# Patient Record
Sex: Female | Born: 1955 | Race: White | Hispanic: No | Marital: Married | Smoking: Never smoker
Health system: Southern US, Community
[De-identification: ages and names within clinical notes are randomized; demographics above are authoritative.]

## PROBLEM LIST (undated history)

## (undated) DIAGNOSIS — F32A Depression, unspecified: Secondary | ICD-10-CM

## (undated) DIAGNOSIS — E079 Disorder of thyroid, unspecified: Secondary | ICD-10-CM

## (undated) DIAGNOSIS — H04129 Dry eye syndrome of unspecified lacrimal gland: Secondary | ICD-10-CM

## (undated) DIAGNOSIS — I341 Nonrheumatic mitral (valve) prolapse: Secondary | ICD-10-CM

## (undated) DIAGNOSIS — D509 Iron deficiency anemia, unspecified: Secondary | ICD-10-CM

## (undated) DIAGNOSIS — F329 Major depressive disorder, single episode, unspecified: Secondary | ICD-10-CM

## (undated) DIAGNOSIS — M3500 Sicca syndrome, unspecified: Secondary | ICD-10-CM

## (undated) DIAGNOSIS — F419 Anxiety disorder, unspecified: Secondary | ICD-10-CM

## (undated) DIAGNOSIS — M810 Age-related osteoporosis without current pathological fracture: Secondary | ICD-10-CM

## (undated) HISTORY — DX: Iron deficiency anemia, unspecified: D50.9

## (undated) HISTORY — DX: Disorder of thyroid, unspecified: E07.9

## (undated) HISTORY — DX: Sicca syndrome, unspecified: M35.00

## (undated) HISTORY — DX: Major depressive disorder, single episode, unspecified: F32.9

## (undated) HISTORY — DX: Anxiety disorder, unspecified: F41.9

## (undated) HISTORY — DX: Dry eye syndrome of unspecified lacrimal gland: H04.129

## (undated) HISTORY — DX: Age-related osteoporosis without current pathological fracture: M81.0

## (undated) HISTORY — PX: ENDOMETRIAL BIOPSY: SHX622

## (undated) HISTORY — DX: Nonrheumatic mitral (valve) prolapse: I34.1

## (undated) HISTORY — DX: Depression, unspecified: F32.A

## (undated) HISTORY — PX: TUBAL LIGATION: SHX77

---

## 1999-02-05 ENCOUNTER — Emergency Department (HOSPITAL_COMMUNITY): Admission: EM | Admit: 1999-02-05 | Discharge: 1999-02-05 | Payer: Self-pay | Admitting: *Deleted

## 1999-04-12 HISTORY — PX: MOUTH SURGERY: SHX715

## 1999-08-18 ENCOUNTER — Other Ambulatory Visit: Admission: RE | Admit: 1999-08-18 | Discharge: 1999-08-18 | Payer: Self-pay | Admitting: Obstetrics and Gynecology

## 1999-10-04 ENCOUNTER — Ambulatory Visit (HOSPITAL_COMMUNITY): Admission: RE | Admit: 1999-10-04 | Discharge: 1999-10-04 | Payer: Self-pay | Admitting: Obstetrics and Gynecology

## 1999-10-04 ENCOUNTER — Encounter: Payer: Self-pay | Admitting: Obstetrics and Gynecology

## 2000-08-16 ENCOUNTER — Other Ambulatory Visit: Admission: RE | Admit: 2000-08-16 | Discharge: 2000-08-16 | Payer: Self-pay | Admitting: Obstetrics and Gynecology

## 2000-09-26 ENCOUNTER — Ambulatory Visit (HOSPITAL_COMMUNITY): Admission: RE | Admit: 2000-09-26 | Discharge: 2000-09-27 | Payer: Self-pay | Admitting: *Deleted

## 2000-10-19 ENCOUNTER — Ambulatory Visit (HOSPITAL_COMMUNITY): Admission: RE | Admit: 2000-10-19 | Discharge: 2000-10-19 | Payer: Self-pay | Admitting: Obstetrics and Gynecology

## 2000-10-19 ENCOUNTER — Encounter: Payer: Self-pay | Admitting: Obstetrics and Gynecology

## 2002-02-13 ENCOUNTER — Other Ambulatory Visit: Admission: RE | Admit: 2002-02-13 | Discharge: 2002-02-13 | Payer: Self-pay | Admitting: Obstetrics and Gynecology

## 2002-05-08 ENCOUNTER — Encounter: Admission: RE | Admit: 2002-05-08 | Discharge: 2002-05-08 | Payer: Self-pay | Admitting: Internal Medicine

## 2002-05-08 ENCOUNTER — Encounter: Payer: Self-pay | Admitting: Internal Medicine

## 2002-05-13 ENCOUNTER — Encounter: Payer: Self-pay | Admitting: Internal Medicine

## 2002-05-13 ENCOUNTER — Ambulatory Visit (HOSPITAL_COMMUNITY): Admission: RE | Admit: 2002-05-13 | Discharge: 2002-05-13 | Payer: Self-pay | Admitting: Internal Medicine

## 2003-04-29 ENCOUNTER — Other Ambulatory Visit: Admission: RE | Admit: 2003-04-29 | Discharge: 2003-04-29 | Payer: Self-pay | Admitting: Obstetrics and Gynecology

## 2003-05-27 ENCOUNTER — Ambulatory Visit (HOSPITAL_COMMUNITY): Admission: RE | Admit: 2003-05-27 | Discharge: 2003-05-27 | Payer: Self-pay | Admitting: Obstetrics and Gynecology

## 2004-02-17 ENCOUNTER — Ambulatory Visit (HOSPITAL_COMMUNITY): Admission: RE | Admit: 2004-02-17 | Discharge: 2004-02-17 | Payer: Self-pay | Admitting: Internal Medicine

## 2004-03-09 ENCOUNTER — Encounter: Admission: RE | Admit: 2004-03-09 | Discharge: 2004-03-09 | Payer: Self-pay | Admitting: Internal Medicine

## 2004-05-12 ENCOUNTER — Other Ambulatory Visit: Admission: RE | Admit: 2004-05-12 | Discharge: 2004-05-12 | Payer: Self-pay | Admitting: Obstetrics and Gynecology

## 2004-08-16 ENCOUNTER — Ambulatory Visit (HOSPITAL_COMMUNITY): Admission: RE | Admit: 2004-08-16 | Discharge: 2004-08-16 | Payer: Self-pay | Admitting: Obstetrics and Gynecology

## 2005-07-10 DIAGNOSIS — D509 Iron deficiency anemia, unspecified: Secondary | ICD-10-CM

## 2005-07-10 HISTORY — DX: Iron deficiency anemia, unspecified: D50.9

## 2005-07-14 ENCOUNTER — Other Ambulatory Visit: Admission: RE | Admit: 2005-07-14 | Discharge: 2005-07-14 | Payer: Self-pay | Admitting: Obstetrics & Gynecology

## 2005-08-17 ENCOUNTER — Ambulatory Visit (HOSPITAL_COMMUNITY): Admission: RE | Admit: 2005-08-17 | Discharge: 2005-08-17 | Payer: Self-pay | Admitting: Gastroenterology

## 2005-08-18 ENCOUNTER — Ambulatory Visit (HOSPITAL_COMMUNITY): Admission: RE | Admit: 2005-08-18 | Discharge: 2005-08-18 | Payer: Self-pay | Admitting: Obstetrics and Gynecology

## 2006-04-11 DIAGNOSIS — M3500 Sicca syndrome, unspecified: Secondary | ICD-10-CM

## 2006-04-11 DIAGNOSIS — H04129 Dry eye syndrome of unspecified lacrimal gland: Secondary | ICD-10-CM

## 2006-04-11 HISTORY — DX: Sjogren syndrome, unspecified: M35.00

## 2006-04-11 HISTORY — DX: Dry eye syndrome of unspecified lacrimal gland: H04.129

## 2006-08-18 ENCOUNTER — Other Ambulatory Visit: Admission: RE | Admit: 2006-08-18 | Discharge: 2006-08-18 | Payer: Self-pay | Admitting: Obstetrics & Gynecology

## 2006-08-22 ENCOUNTER — Ambulatory Visit (HOSPITAL_COMMUNITY): Admission: RE | Admit: 2006-08-22 | Discharge: 2006-08-22 | Payer: Self-pay | Admitting: Obstetrics & Gynecology

## 2007-09-13 ENCOUNTER — Ambulatory Visit (HOSPITAL_COMMUNITY): Admission: RE | Admit: 2007-09-13 | Discharge: 2007-09-13 | Payer: Self-pay | Admitting: Obstetrics and Gynecology

## 2007-09-19 ENCOUNTER — Other Ambulatory Visit: Admission: RE | Admit: 2007-09-19 | Discharge: 2007-09-19 | Payer: Self-pay | Admitting: Gynecology

## 2008-09-19 ENCOUNTER — Ambulatory Visit (HOSPITAL_COMMUNITY): Admission: RE | Admit: 2008-09-19 | Discharge: 2008-09-19 | Payer: Self-pay | Admitting: Obstetrics and Gynecology

## 2008-09-24 ENCOUNTER — Encounter: Admission: RE | Admit: 2008-09-24 | Discharge: 2008-09-24 | Payer: Self-pay | Admitting: Obstetrics and Gynecology

## 2009-02-09 HISTORY — PX: AUGMENTATION MAMMAPLASTY: SUR837

## 2010-05-01 ENCOUNTER — Encounter: Payer: Self-pay | Admitting: Obstetrics and Gynecology

## 2010-05-03 ENCOUNTER — Encounter: Payer: Self-pay | Admitting: Obstetrics and Gynecology

## 2010-05-06 ENCOUNTER — Other Ambulatory Visit: Payer: Self-pay | Admitting: Obstetrics and Gynecology

## 2010-05-06 DIAGNOSIS — Z1231 Encounter for screening mammogram for malignant neoplasm of breast: Secondary | ICD-10-CM

## 2010-05-06 DIAGNOSIS — Z1239 Encounter for other screening for malignant neoplasm of breast: Secondary | ICD-10-CM

## 2010-05-20 ENCOUNTER — Ambulatory Visit (HOSPITAL_COMMUNITY)
Admission: RE | Admit: 2010-05-20 | Discharge: 2010-05-20 | Disposition: A | Discharge status: Home / Self Care | Payer: 59 | Source: Ambulatory Visit | Attending: Obstetrics and Gynecology | Admitting: Obstetrics and Gynecology

## 2010-05-20 ENCOUNTER — Other Ambulatory Visit: Payer: Self-pay | Admitting: Obstetrics and Gynecology

## 2010-05-20 DIAGNOSIS — Z1231 Encounter for screening mammogram for malignant neoplasm of breast: Secondary | ICD-10-CM | POA: Insufficient documentation

## 2010-08-27 NOTE — Op Note (Signed)
[place]. [facility]  Patient:    Toni Price, Toni Price                     MRN: [id number] Proc. Date: 09/26/00 Attending:  Saddie Benders, D.D.S.                           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  SURGEONS:  Saddie Benders, D.D.S., and Dionne Ano. Gwyneth Sprout., D.D.S.  PROCEDURE:  One-piece total maxillary osteotomy for advancement and closure of open bite.  DESCRIPTION OF PROCEDURE:  On September 26, 2000, this patient was brought to the operating room of [facility], where after obtaining the proper level of anesthesia with the use of nasotracheal intubation, a nasogastric tube was also placed and then a two-inch wet vaginal gauze packing was placed in the oropharynx.  The patient underwent an intraoral and extraoral Betadine prep. Sterile drapes were used to isolate the surgical field, and 0.5% Marcaine with 1:200,000 epinephrine was utilized for local anesthesia to the right and left maxillary vestibule.  At this time, the Bovie was used to make a circumvestibular incision from the maxillary right first molar region high in the vestibule to the maxillary left first molar region.  Periosteal elevator was used to reflect this incision superiorly to expose the lateral maxilla. Tunneling was completed from the right and left zygomatic arches posteriorly to the junction of the pterygoid plates with the posterior maxilla.  A periosteal elevator and curved Glorious Peach were also used to separate the soft tissues of the nose from the piriform rim region.  At this point, reference points 10 mm apart were placed in the right and left zygomatic buttress regions and the right and left piriform rim regions.  The  Stryker drill with a tapered Fisher bur was then used to make a horizontal cut from the right zygomatic buttress region anteriorly to the right piriform rim region.  The same cut was completed on the left side of the maxilla.  The reciprocating saw was then used to cut the posterior maxilla from the zygomatic buttress back to the junction with the pterygoid plate.  At this time, a spatula osteotome was used to separate the medial wall of the sinus on both sides, and the nasal septal osteotome was used to separate the vomer from the nasal crest of the maxilla.  The pterygomaxillary osteotome was now used to separate the posterior wall of the maxilla from the pterygoid plates and now while applying digital pressure to the right and left cuspid region, the maxilla was downfractured as soft tissues were freed from the superior aspect of the maxilla.  This downfracture was completed without complication, and any sharp bony fragments were removed with the Leksell rongeur.  In the posterior region of the maxilla, the Stryker drill with an alveoloplasty bur was used to reduce the posterior wall of the maxilla in a vertical fashion to allow for the planned superior movement of the posterior maxilla.  The nasal crest of the maxilla was rongeuered with the Leksell rongeur and also smoothed off with the alveoloplasty bur and the Stryker drill.  Thorough irrigation was now used throughout the entire osteotomy site, and the nasal tissues in the midline were thoroughly irrigated  and then reapproximated with 4-0 chromic suture in a continuous fashion.  At this point, the maxilla was placed into intermaxillary fixation with a previously-constructed surgical splint, and intermaxillary fixation was obtained utilizing six #26 gauge stainless steel wires.  The mandible was then autorotated superiorly, and at the beginning there was a small interference on the posterior aspect of the maxilla on the right  side, which was removed with the Stryker drill.  The mandible now autorotated easily without any interference.  At this point, the vertical reference points were measured, and the planned amount of superior impaction in the posterior aspect had been obtained.  This was approximately 4 mm on the right side and 3 mm on the left side.  Again, the entire osteotomy site was thoroughly irrigated and suctioned, and now using 1.5 mm KLS Martin titanium plates, the maxilla was secured utilizing a plate on the right and left zygomatic buttress region and also a plate on the right and left piriform rim regions.  Once the four plates were secured, the intermaxillary fixation wires were cut, and the occlusion was found to be the same as obtained upon the surgical models.  At this point, the osteotomy site was thoroughly irrigated, and soft tissue suturing was initiated by first doing an alar cinch technique to reposition the transverse nasalis muscles utilizing 2-0 Vicryl in an interrupted fashion.  Next, a V-Y closure of the maxillary incision was completed utilizing 4-0 Vicryl in a continuous fashion.  At this stage, the oral cavity was thoroughly irrigated and suctioned, and the previously-constructed maxillary splint was secured to the maxillary teeth utilizing four 28-gauge stainless steel wires.  The oral cavity was again irrigated and suctioned, and the throat pack was removed and intermaxillary fixation was achieved utilizing elastics.  This patient was then extubated in the operating room and transferred to the recovery room in stable condition.  TIME OF PROCEDURE:  2-1/2 hours.  ESTIMATED BLOOD LOSS:  250 cc.  ANESTHESIA:  General.  COMPLICATIONS:  Without.  CONDITION:  Patient on last exam was deemed satisfactory. DD:  09/26/00 TD:  09/26/00 Job: [id number]

## 2011-06-01 ENCOUNTER — Other Ambulatory Visit: Payer: Self-pay | Admitting: Obstetrics and Gynecology

## 2011-06-01 DIAGNOSIS — Z1231 Encounter for screening mammogram for malignant neoplasm of breast: Secondary | ICD-10-CM

## 2011-06-03 ENCOUNTER — Ambulatory Visit (HOSPITAL_COMMUNITY)
Admission: RE | Admit: 2011-06-03 | Discharge: 2011-06-03 | Disposition: A | Discharge status: Home / Self Care | Payer: 59 | Source: Ambulatory Visit | Attending: Obstetrics and Gynecology | Admitting: Obstetrics and Gynecology

## 2011-06-03 ENCOUNTER — Other Ambulatory Visit: Payer: Self-pay | Admitting: Obstetrics and Gynecology

## 2011-06-03 DIAGNOSIS — Z1231 Encounter for screening mammogram for malignant neoplasm of breast: Secondary | ICD-10-CM

## 2011-06-28 ENCOUNTER — Ambulatory Visit (HOSPITAL_COMMUNITY)
Admission: RE | Admit: 2011-06-28 | Discharge: 2011-06-28 | Disposition: A | Discharge status: Home / Self Care | Payer: 59 | Source: Ambulatory Visit | Attending: Obstetrics and Gynecology | Admitting: Obstetrics and Gynecology

## 2011-06-28 DIAGNOSIS — Z1231 Encounter for screening mammogram for malignant neoplasm of breast: Secondary | ICD-10-CM | POA: Insufficient documentation

## 2012-06-04 ENCOUNTER — Other Ambulatory Visit (HOSPITAL_COMMUNITY): Payer: Self-pay | Admitting: Obstetrics and Gynecology

## 2012-06-04 DIAGNOSIS — Z1231 Encounter for screening mammogram for malignant neoplasm of breast: Secondary | ICD-10-CM

## 2012-06-28 ENCOUNTER — Ambulatory Visit (HOSPITAL_COMMUNITY): Payer: 59 | Attending: Obstetrics and Gynecology

## 2012-10-08 ENCOUNTER — Other Ambulatory Visit (HOSPITAL_COMMUNITY): Payer: Self-pay | Admitting: Obstetrics and Gynecology

## 2012-10-08 DIAGNOSIS — Z1231 Encounter for screening mammogram for malignant neoplasm of breast: Secondary | ICD-10-CM

## 2012-10-16 ENCOUNTER — Ambulatory Visit (HOSPITAL_COMMUNITY): Payer: 59 | Attending: Obstetrics and Gynecology

## 2012-10-17 ENCOUNTER — Ambulatory Visit (HOSPITAL_COMMUNITY)
Admission: RE | Admit: 2012-10-17 | Discharge: 2012-10-17 | Disposition: A | Discharge status: Home / Self Care | Payer: 59 | Source: Ambulatory Visit | Attending: Obstetrics and Gynecology | Admitting: Obstetrics and Gynecology

## 2012-10-17 ENCOUNTER — Other Ambulatory Visit: Payer: Self-pay | Admitting: Obstetrics and Gynecology

## 2012-10-17 DIAGNOSIS — Z1231 Encounter for screening mammogram for malignant neoplasm of breast: Secondary | ICD-10-CM

## 2012-10-19 LAB — HM MAMMOGRAPHY

## 2012-10-22 ENCOUNTER — Other Ambulatory Visit: Payer: Self-pay | Admitting: Obstetrics and Gynecology

## 2012-10-22 DIAGNOSIS — R928 Other abnormal and inconclusive findings on diagnostic imaging of breast: Secondary | ICD-10-CM

## 2012-11-01 ENCOUNTER — Other Ambulatory Visit: Payer: 59

## 2012-11-01 ENCOUNTER — Ambulatory Visit
Admission: RE | Admit: 2012-11-01 | Discharge: 2012-11-01 | Disposition: A | Discharge status: Home / Self Care | Payer: 59 | Source: Ambulatory Visit | Attending: Obstetrics and Gynecology | Admitting: Obstetrics and Gynecology

## 2012-11-01 DIAGNOSIS — R928 Other abnormal and inconclusive findings on diagnostic imaging of breast: Secondary | ICD-10-CM

## 2013-01-07 ENCOUNTER — Telehealth: Payer: Self-pay | Admitting: Obstetrics and Gynecology

## 2013-01-07 NOTE — Telephone Encounter (Signed)
Pt has not had aex since 2012. Pt made appt for aex with dr Edward Jolly & will discuss if she can start back on premarin or not

## 2013-01-07 NOTE — Telephone Encounter (Signed)
Pharmacy Architect in [place] [phone]).

## 2013-01-07 NOTE — Telephone Encounter (Signed)
Pt is requesting a refill on premarin

## 2013-01-21 ENCOUNTER — Ambulatory Visit (INDEPENDENT_AMBULATORY_CARE_PROVIDER_SITE_OTHER): Payer: 59 | Admitting: Obstetrics and Gynecology

## 2013-01-21 ENCOUNTER — Encounter: Payer: Self-pay | Admitting: Obstetrics and Gynecology

## 2013-01-21 VITALS — BP 100/68 | HR 70 | Ht 62.5 in | Wt 135.0 lb

## 2013-01-21 DIAGNOSIS — Z0000 Encounter for general adult medical examination without abnormal findings: Secondary | ICD-10-CM

## 2013-01-21 DIAGNOSIS — Z01419 Encounter for gynecological examination (general) (routine) without abnormal findings: Secondary | ICD-10-CM

## 2013-01-21 DIAGNOSIS — R3129 Other microscopic hematuria: Secondary | ICD-10-CM

## 2013-01-21 LAB — POCT URINALYSIS DIPSTICK
Glucose, UA: NEGATIVE
Ketones, UA: NEGATIVE
Protein, UA: NEGATIVE

## 2013-01-21 MED ORDER — ESTRADIOL 0.1 MG/GM VA CREA: TOPICAL_CREAM | VAGINAL | Status: DC

## 2013-01-21 NOTE — Patient Instructions (Signed)

## 2013-01-21 NOTE — Addendum Note (Signed)
Addended by: Conley Simmonds on: 01/21/2013 01:05 PM   Modules accepted: Orders

## 2013-01-21 NOTE — Progress Notes (Signed)
Patient ID: Toni Price, female   DOB: Aug 16, 1955, 57 y.o.   MRN: [id number] GYNECOLOGY VISIT  PCP:  Kyra Leyland Dillow, PA-C - will have first appointment next week, [facility] in [place].   Referring provider:   HPI: 57 y.o.   Married  Caucasian  female   [phone] with Patient's last menstrual period was 08/09/2009.   here for  AEX.   New diagnosis of hypothyroidism.  On levothyroxine.  On Wellbutrin XL for depression.  Notes increased symptoms since diagnosed with hypothyroid. Lexapro blunted her emotions too much.   Occasionally takes 1/2 Lexapro which really helps her.   PCP has treated the patient's depression in the past.    Having vaginal dryness.  Still has some hot flashes.   Hgb:  PCP Urine: Trace RBC's   GYNECOLOGIC HISTORY: Patient's last menstrual period was 08/09/2009. Sexually active:  yes Partner preference: female Contraception:  Tubal  Menopausal hormone therapy: no DES exposure:  no  Blood transfusions:   no Sexually transmitted diseases:   no GYN Procedures: endometrial biopsy 06/25/11 for spotting and pain.  Benign proliferative endometrium.  Pelvic ultrasound - uterus 8 x 3 x 5 cm, EMS 6 mm.  Normal adnexa.  Mammogram:  10-19-12 with additional views of left breast on 11-01-12 all wnl:[facility].              Pap:   09-21-09 wnl History of abnormal pap smear:  no   OB History   Grav Para Term Preterm Abortions TAB SAB Ect Mult Living   3 3 3       3        LIFESTYLE: Exercise:      Yoga, light weights and treadclimber         Tobacco:       no Alcohol:          1-2 drinks per week Drug use:       no  OTHER HEALTH MAINTENANCE: Tetanus/TDap:   2007 Gardisil:  NA Influenza:   12/2012 Zostavax:  NA  Bone density:  07/2012 in [place]:osteoporosis Colonoscopy:  11-29-12 revealed polyps and next colonoscopy due 11/2015:[place]  Cholesterol check: 07/2012 wnl  Family History  Problem Relation Age of Onset  .  Hypertension Father   . Diabetes Father   . Breast cancer Paternal Grandmother     ?skin CA instead of Breast CA  . Diabetes Paternal Grandmother   . Hypertension Mother   . Thyroid disease Mother     There are no active problems to display for this patient.  Past Medical History  Diagnosis Date  . MVP (mitral valve prolapse)     --requires prophylaxis  . Iron deficiency anemia 07/2005  . Dry eye syndrome 2008  . Sjogren's disease 2008  . Thyroid disease     hypothyroid  . Anxiety   . Depression   . Osteoporosis     Past Surgical History  Procedure Laterality Date  . Endometrial biopsy  06/2006,06/2011    06/2006--benign proliferative endometrium, 06/2011--benign proliferative endometrium  . Augmentation mammaplasty  02/2009    [place]-Dr. Benna Dunks  . Tubal ligation    . Mouth surgery  2001    upper jaw surgery--Dr. Orvan Falconer    ALLERGIES: Sulfa antibiotics  Current Outpatient Prescriptions  Medication Sig Dispense Refill  . aspirin 81 MG tablet Take 81 mg by mouth daily.      Marland Kitchen buPROPion (WELLBUTRIN XL) 300 MG 24 hr tablet Take 300 mg by mouth  daily.      . calcium carbonate (OS-CAL) 600 MG TABS tablet Take 600 mg by mouth 2 (two) times daily with a meal.      . cyanocobalamin (,VITAMIN B-12,) 1000 MCG/ML injection Inject 1,000 mcg into the muscle every 30 (thirty) days.       Marland Kitchen levothyroxine (SYNTHROID, LEVOTHROID) 50 MCG tablet Take 50 mcg by mouth daily before breakfast.      . magnesium 30 MG tablet Take 30 mg by mouth 2 (two) times daily.      . Zinc 25 MG TABS Take 1 tablet by mouth daily.       No current facility-administered medications for this visit.     ROS:  Pertinent items are noted in HPI.  SOCIAL HISTORY:  Charity fundraiser for [employer], post partum.  PHYSICAL EXAMINATION:    BP 100/68  Pulse 70  Ht 5' 2.5" (1.588 m)  Wt 135 lb (61.236 kg)  BMI 24.28 kg/m2  LMP 08/09/2009   Wt Readings from Last 3 Encounters:  01/21/13 135 lb (61.236 kg)     Ht Readings  from Last 3 Encounters:  01/21/13 5' 2.5" (1.588 m)    General appearance: alert, cooperative and appears stated age Head: Normocephalic, without obvious abnormality, atraumatic Neck: no adenopathy, supple, symmetrical, trachea midline and thyroid not enlarged, symmetric, no tenderness/mass/nodules Lungs: clear to auscultation bilaterally Breasts: Inspection negative, No nipple retraction or dimpling, No nipple discharge or bleeding, No axillary or supraclavicular adenopathy, Normal to palpation without dominant masses Heart: regular rate and rhythm Abdomen: soft, non-tender; no masses,  no organomegaly Extremities: extremities normal, atraumatic, no cyanosis or edema Skin: Skin color, texture, turgor normal. No rashes or lesions Lymph nodes: Cervical, supraclavicular, and axillary nodes normal. No abnormal inguinal nodes palpated Neurologic: Grossly normal  Pelvic: External genitalia:  no lesions              Urethra:  normal appearing urethra with no masses, tenderness or lesions              Bartholins and Skenes: normal                 Vagina: normal appearing vagina with normal color and discharge, no lesions              Cervix: normal appearance              Pap and high risk HPV testing done: yes.            Bimanual Exam:  Uterus:  uterus is normal size, shape, consistency and nontender                                      Adnexa: normal adnexa in size, nontender and no masses                                      Rectovaginal: Confirms                                      Anus:  normal sphincter tone, no lesions  ASSESSMENT  Normal gynecologic exam. Atrophy of vagina. Microscopic hematuria - recurrent per patient.   PLAN  Mammogram Pap smear and high risk HPV testing Counseled  on  Local vaginal estrogen therapy.  See Rx for Estrace.  Risks and benefits discussed. Urine microscopic sent. Return annually or prn   An After Visit Summary was printed and given to the  patient.

## 2013-01-22 LAB — URINALYSIS, MICROSCOPIC ONLY
Casts: NONE SEEN
Squamous Epithelial / LPF: NONE SEEN

## 2013-01-23 LAB — IPS PAP TEST WITH HPV

## 2013-02-14 ENCOUNTER — Other Ambulatory Visit: Payer: Self-pay

## 2013-12-05 ENCOUNTER — Encounter: Payer: Self-pay | Admitting: Obstetrics and Gynecology

## 2014-01-22 ENCOUNTER — Ambulatory Visit: Payer: 59 | Admitting: Obstetrics and Gynecology

## 2014-02-05 ENCOUNTER — Ambulatory Visit (INDEPENDENT_AMBULATORY_CARE_PROVIDER_SITE_OTHER): Payer: 59 | Admitting: Obstetrics and Gynecology

## 2014-02-05 ENCOUNTER — Encounter: Payer: Self-pay | Admitting: Obstetrics and Gynecology

## 2014-02-05 VITALS — BP 100/62 | HR 66 | Resp 16 | Ht 62.5 in | Wt 141.4 lb

## 2014-02-05 DIAGNOSIS — Z01419 Encounter for gynecological examination (general) (routine) without abnormal findings: Secondary | ICD-10-CM

## 2014-02-05 DIAGNOSIS — M810 Age-related osteoporosis without current pathological fracture: Secondary | ICD-10-CM

## 2014-02-05 NOTE — Patient Instructions (Signed)

## 2014-02-05 NOTE — Progress Notes (Signed)
Patient ID: Toni PlanMary E Price, female   DOB: Dec 21, 1955, 58 y.o.   MRN: [id number] 58 y.o. [id number] MarriedCaucasianF here for annual exam.    Now on Lexapro.  Intercourse is painful. Deep penetration is painful.  Wondering if she has prolapse. Has to push to have bowel movements.  Uses two stool softeners a day.  States she has a dx of a rectocele.  Notes this more with standing (States this after exam complete.) Leaks urine if laughs too hard.  Last delivery was 9 pound child.   Not using vaginal estrogen cream regularly.  Daughter had a pulmonary embolus and stroke at age 56 while on the combined oral contraceptives. Her work up was negative.   Due for mammogram.   Has implants.   Mother with pancreatic cancer dx in August.  Father with dementia.  Parents are living with patient.   Husband lost job.   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   Patient's last menstrual period was 08/09/2009.          Sexually active: No.female  The current method of family planning is tubal ligation.    Exercising: Yes.    tread climber and treadmill. Smoker:  no  Health Maintenance: Pap:  01-21-13 wnl:neg HR HPV History of abnormal Pap:  no MMG:  10-19-12 heterogeneous dense:left breast asymmetry.  Had diagnostic mammogram of left breast with fibroglandular density but no abnormality.  Recommend 1 year screening. Patient will schedule. Colonoscopy: 11-29-12 revealed polyps and next colon[place].  BMD:   07/2012 in [place]:osteoporosis in spine and hips - not treating. [facility].  Needs recheck in 2016.  TDaP:  2007 Screening:  Hb today: PCP, Urine today: unable to void   reports that she has never smoked. She does not have any smokeless tobacco history on file. She reports that she does not drink alcohol or use illicit drugs.  Past Medical History  Diagnosis Date  . MVP (mitral valve prolapse)     --requires prophylaxis  .  Iron deficiency anemia 07/2005  . Dry eye syndrome 2008  . Sjogren's disease 2008  . Thyroid disease     hypothyroid  . Anxiety   . Depression   . Osteoporosis     Past Surgical History  Procedure Laterality Date  . Endometrial biopsy  06/2006,06/2011    06/2006--benign proliferative endometrium, 06/2011--benign proliferative endometrium  . Augmentation mammaplasty  02/2009    [place]-Dr. Benna DunksBarber  . Tubal ligation    . Mouth surgery  2001    upper jaw surgery--Dr. Orvan Falconerampbell    Current Outpatient Prescriptions  Medication Sig Dispense Refill  . Biotin 1 MG CAPS Take 1 mg by mouth daily.      . calcium carbonate (OS-CAL) 600 MG TABS tablet Take 600 mg by mouth 2 (two) times daily with a meal.      . Calcium-Magnesium-Vitamin D (CORAL CALCIUM) 185-50-100 MG-MG-UNIT CAPS Take 2 tablets by mouth daily.      . cyanocobalamin (,VITAMIN B-12,) 1000 MCG/ML injection Inject 1,000 mcg into the muscle every 30 (thirty) days.       Marland Kitchen. escitalopram (LEXAPRO) 10 MG tablet Take 10 mg by mouth daily.      . hydroxychloroquine (PLAQUENIL) 200 MG tablet Take 200 mg by mouth 2 (two) times daily.      Marland Kitchen. levothyroxine (SYNTHROID, LEVOTHROID) 50 MCG tablet Take 50 mcg by mouth daily before breakfast.      . magnesium 30  MG tablet Take 30 mg by mouth 2 (two) times daily.      Marland Kitchen. terbinafine (LAMISIL) 250 MG tablet Take 250 mg by mouth.      . valACYclovir (VALTREX) 1000 MG tablet Take 1 g by mouth as needed.      . Zinc 25 MG TABS Take 1 tablet by mouth daily.       No current facility-administered medications for this visit.    Family History  Problem Relation Age of Onset  . Hypertension Father   . Diabetes Father   . Dementia Father   . Breast cancer Paternal Grandmother     ?skin CA instead of Breast CA  . Diabetes Paternal Grandmother   . Hypertension Mother   . Thyroid disease Mother   . Cancer Mother [phone]    pancreatic    ROS:  Pertinent items are noted in HPI.  Otherwise, a comprehensive ROS  was negative.  Exam:   BP 100/62  Pulse 66  Resp 16  Ht 5' 2.5" (1.588 m)  Wt 141 lb 6.4 oz (64.139 kg)  BMI 25.43 kg/m2  LMP 08/09/2009     Height: 5' 2.5" (158.8 cm)  Ht Readings from Last 3 Encounters:  02/05/14 5' 2.5" (1.588 m)  01/21/13 5' 2.5" (1.588 m)    General appearance: alert, cooperative and appears stated age Head: Normocephalic, without obvious abnormality, atraumatic Neck: no adenopathy, supple, symmetrical, trachea midline and thyroid normal to inspection and palpation Lungs: clear to auscultation bilaterally Breasts: normal appearance, no masses or tenderness, No nipple retraction or dimpling, No nipple discharge or bleeding, No axillary or supraclavicular adenopathy,  bilateral implants. Heart: regular rate and rhythm Abdomen: soft, non-tender; bowel sounds normal; no masses,  no organomegaly Extremities: extremities normal, atraumatic, no cyanosis or edema Skin: Skin color, texture, turgor normal. No rashes or lesions Lymph nodes: Cervical, supraclavicular, and axillary nodes normal. No abnormal inguinal nodes palpated Neurologic: Grossly normal   Pelvic: External genitalia:  no lesions              Urethra:  normal appearing urethra with no masses, tenderness or lesions              Bartholins and Skenes: normal                 Vagina: normal appearing vagina with normal color and discharge, no lesions              Cervix: no lesions              Pap taken: No. Bimanual Exam:  Uterus:  normal size, contour, position, consistency, mobility, non-tender              Adnexa: normal adnexa and no mass, fullness, tenderness               Rectovaginal: Confirms               Anus:  normal sphincter tone, no lesions  A:  Well Woman with normal exam Bilateral implants of breast.  Osteoporosis.  Untreated.  Possible rectocele?  Not evident in supine position.  Atrophic vaginitis.  Family history of stroke and PE on combined OCPs.   P:   Mammogram due.   Patient will schedule.  pap smear not indicated this year.  Avoid heavy lifting, use stool softeners, Kegel exercises.  Vit E and cooking oils for atrophy. Return with bone density for consultation regarding osteoporosis.  Weight bearing activity, Ca, Vit D.  return annually or prn  An After Visit Summary was printed and given to the patient.

## 2014-02-10 ENCOUNTER — Encounter: Payer: Self-pay | Admitting: Obstetrics and Gynecology

## 2014-11-20 IMAGING — MG MM DIGITAL DIAGNOSTIC LIMITED*L*
2 series · 2 of 2 positions shown · non-contrast
Comparison: 10/17/2012 and multiple prior studies

CLINICAL DATA: The patient returns after screening study for
evaluation of the left breast.

DIGITAL DIAGNOSTIC LEFT MAMMOGRAM WITH CAD

[L ML]
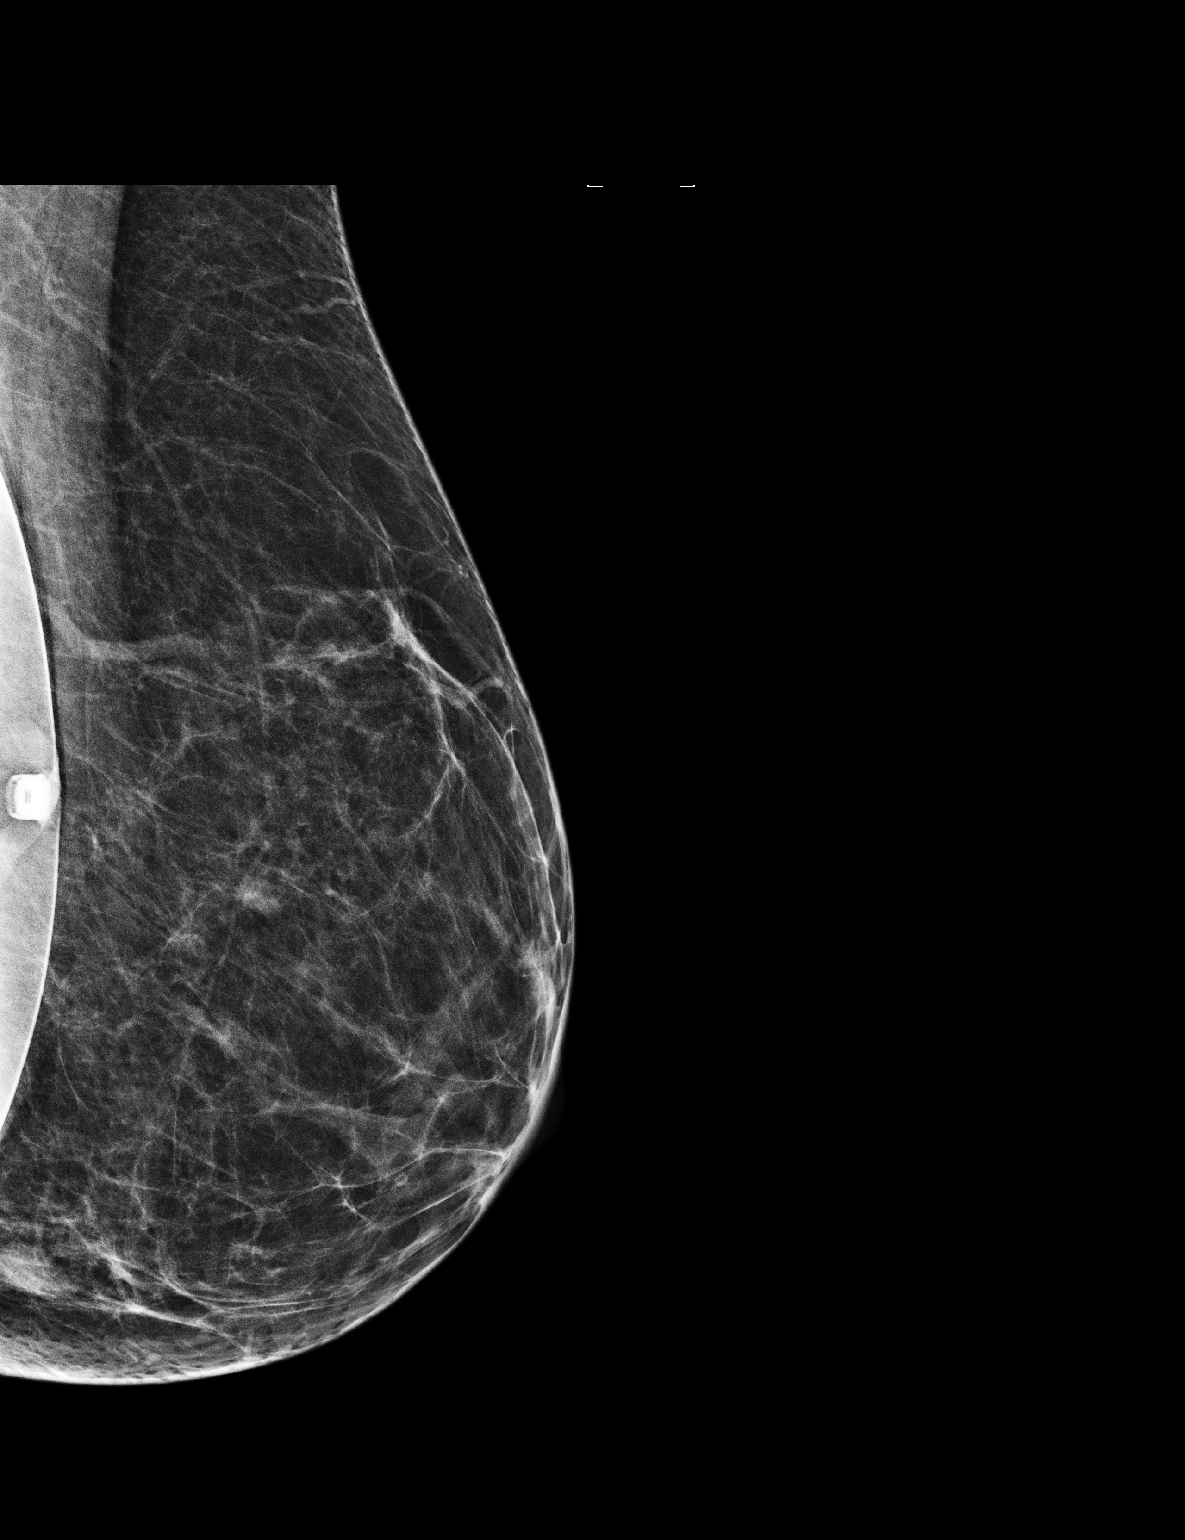

[L CC]
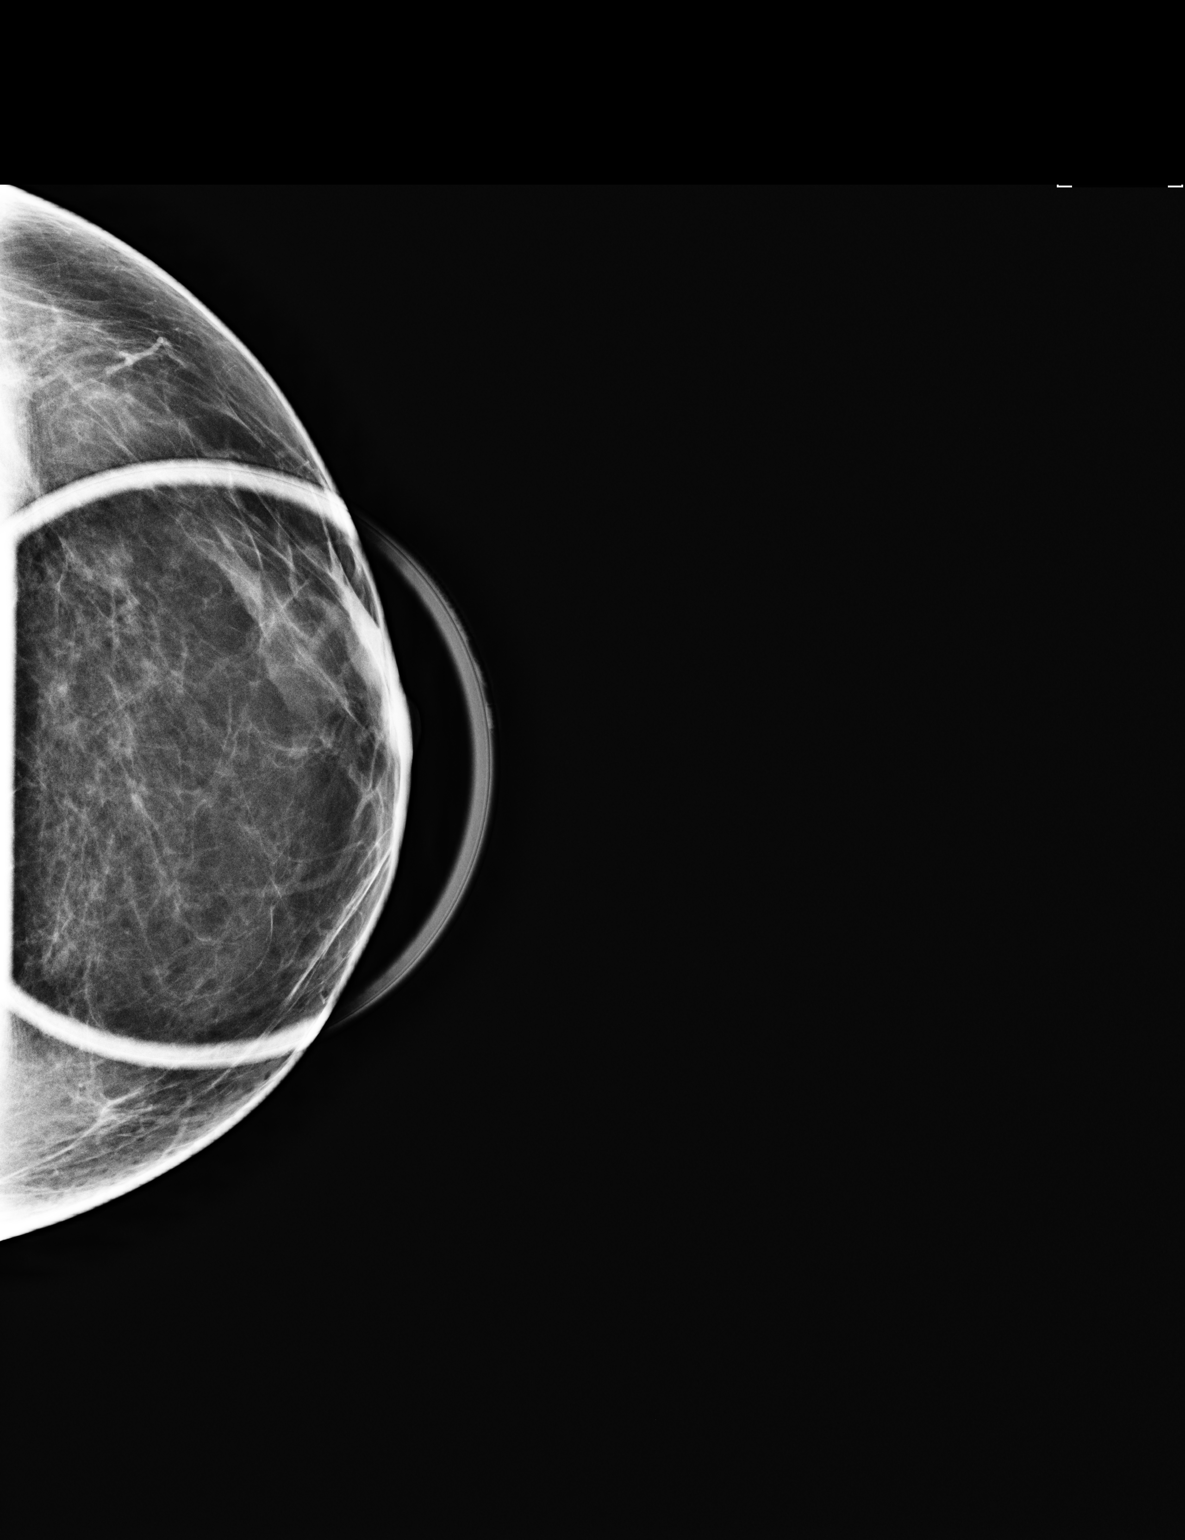

[2 of 2 positions shown; findings below may reference images not displayed]

FINDINGS: ACR Breast Density Category b:  There are scattered areas of
fibroglandular density.

Additional views are performed, showing no persistent abnormality
in the subareolar region of the left breast.

Mammographic images were processed with CAD.
IMPRESSION: No mammographic evidence for malignancy.

RECOMMENDATION:
Screening mammogram in one year. (Code:A1-2-S9I)

I have discussed the findings and recommendations with the patient.
Results were also provided in writing at the conclusion of the
visit.  If applicable, a reminder letter will be sent to the
patient regarding her next appointment.

BI-RADS CATEGORY 1:  Negative.

## 2015-02-09 ENCOUNTER — Ambulatory Visit: Payer: Self-pay | Admitting: Obstetrics and Gynecology

## 2015-02-09 ENCOUNTER — Telehealth: Payer: Self-pay | Admitting: Obstetrics and Gynecology

## 2015-02-09 NOTE — Telephone Encounter (Signed)
Thank you for the update!

## 2015-02-09 NOTE — Telephone Encounter (Signed)
Patient called and cancelled her AEX for today with Dr. Edward JollySilva. She said, "I threw my back out and I'll have to call back to reschedule." Patient is in recall.

## 2016-07-22 DIAGNOSIS — Z8639 Personal history of other endocrine, nutritional and metabolic disease: Secondary | ICD-10-CM | POA: Insufficient documentation

## 2016-07-22 DIAGNOSIS — Z8659 Personal history of other mental and behavioral disorders: Secondary | ICD-10-CM | POA: Insufficient documentation

## 2016-07-22 DIAGNOSIS — Z8349 Family history of other endocrine, nutritional and metabolic diseases: Secondary | ICD-10-CM | POA: Insufficient documentation

## 2016-07-25 ENCOUNTER — Ambulatory Visit (INDEPENDENT_AMBULATORY_CARE_PROVIDER_SITE_OTHER): Payer: 59 | Admitting: Rheumatology

## 2016-07-25 ENCOUNTER — Encounter: Payer: Self-pay | Admitting: Rheumatology

## 2016-07-25 VITALS — BP 120/64 | HR 78 | Resp 14 | Ht 62.0 in | Wt 141.0 lb

## 2016-07-25 DIAGNOSIS — Z8659 Personal history of other mental and behavioral disorders: Secondary | ICD-10-CM | POA: Diagnosis not present

## 2016-07-25 DIAGNOSIS — M3501 Sicca syndrome with keratoconjunctivitis: Secondary | ICD-10-CM

## 2016-07-25 DIAGNOSIS — Z8639 Personal history of other endocrine, nutritional and metabolic disease: Secondary | ICD-10-CM

## 2016-07-25 DIAGNOSIS — M810 Age-related osteoporosis without current pathological fracture: Secondary | ICD-10-CM

## 2016-07-25 DIAGNOSIS — E538 Deficiency of other specified B group vitamins: Secondary | ICD-10-CM | POA: Diagnosis not present

## 2016-07-25 LAB — CBC WITH DIFFERENTIAL/PLATELET
BASOS PCT: 0 %
Basophils Absolute: 0 cells/uL (ref 0–200)
EOS ABS: 276 {cells}/uL (ref 15–500)
Eosinophils Relative: 6 %
HCT: 37.5 % (ref 35.0–45.0)
Hemoglobin: 12.4 g/dL (ref 11.7–15.5)
Lymphocytes Relative: 29 %
Lymphs Abs: 1334 cells/uL (ref 850–3900)
MCH: 27.4 pg (ref 27.0–33.0)
MCHC: 33.1 g/dL (ref 32.0–36.0)
MCV: 83 fL (ref 80.0–100.0)
MONOS PCT: 9 %
MPV: 10.5 fL (ref 7.5–12.5)
Monocytes Absolute: 414 cells/uL (ref 200–950)
NEUTROS ABS: 2576 {cells}/uL (ref 1500–7800)
Neutrophils Relative %: 56 %
PLATELETS: 276 10*3/uL (ref 140–400)
RBC: 4.52 MIL/uL (ref 3.80–5.10)
RDW: 15.5 % — ABNORMAL HIGH (ref 11.0–15.0)
WBC: 4.6 10*3/uL (ref 3.8–10.8)

## 2016-07-25 NOTE — Patient Instructions (Addendum)
Standing Labs We placed an order today for your standing lab work.    Please come back and get your standing labs in 5 months (September 2018)  We have open lab Monday through Friday from 8:30-11:30 AM and 1:30-4 PM at the office of Dr. Arbutus Ped, PA.   The office is located at [address], [place], [place] [address] No appointment is necessary.   Labs are drawn by [employer] [facility] [employer].  You may receive a bill from [facility] for your lab work.    Hydroxychloroquine tablets What is this medicine? HYDROXYCHLOROQUINE (hye drox ee KLOR oh kwin) is used to treat rheumatoid arthritis and systemic lupus erythematosus. It is also used to treat malaria. This medicine may be used for other purposes; ask your health care provider or pharmacist if you have questions. COMMON BRAND NAME(S): Plaquenil, Quineprox What should I tell my health care provider before I take this medicine? They need to know if you have any of these conditions: -diabetes -eye disease, vision problems -G6PD deficiency -history of blood diseases -history of irregular heartbeat -if you often drink alcohol -kidney disease -liver disease -porphyria -psoriasis -seizures -an unusual or allergic reaction to chloroquine, hydroxychloroquine, other medicines, foods, dyes, or preservatives -pregnant or trying to get pregnant -breast-feeding How should I use this medicine? Take this medicine by mouth with a glass of water. Follow the directions on the prescription label. Avoid taking antacids within 4 hours of taking this medicine. It is best to separate these medicines by at least 4 hours. Do not cut, crush or chew this medicine. You can take it with or without food. If it upsets your stomach, take it with food. Take your medicine at regular intervals. Do not take your medicine more often than directed. Take all of your medicine as directed even if you think you are better. Do not skip doses or stop your medicine  early. Talk to your pediatrician regarding the use of this medicine in children. While this drug may be prescribed for selected conditions, precautions do apply. Overdosage: If you think you have taken too much of this medicine contact a poison control center or emergency room at once. NOTE: This medicine is only for you. Do not share this medicine with others. What if I miss a dose? If you miss a dose, take it as soon as you can. If it is almost time for your next dose, take only that dose. Do not take double or extra doses. What may interact with this medicine? Do not take this medicine with any of the following medications: -cisapride -dofetilide -dronedarone -live virus vaccines -penicillamine -pimozide -thioridazine -ziprasidone This medicine may also interact with the following medications: -ampicillin -antacids -cimetidine -cyclosporine -digoxin -medicines for diabetes, like insulin, glipizide, glyburide -medicines for seizures like carbamazepine, phenobarbital, phenytoin -mefloquine -methotrexate -other medicines that prolong the QT interval (cause an abnormal heart rhythm) -praziquantel This list may not describe all possible interactions. Give your health care provider a list of all the medicines, herbs, non-prescription drugs, or dietary supplements you use. Also tell them if you smoke, drink alcohol, or use illegal drugs. Some items may interact with your medicine. What should I watch for while using this medicine? Tell your doctor or healthcare professional if your symptoms do not start to get better or if they get worse. Avoid taking antacids within 4 hours of taking this medicine. It is best to separate these medicines by at least 4 hours. Tell your doctor or health care professional right  away if you have any change in your eyesight. Your vision and blood may be tested before and during use of this medicine. This medicine can make you more sensitive to the sun. Keep  out of the sun. If you cannot avoid being in the sun, wear protective clothing and use sunscreen. Do not use sun lamps or tanning beds/booths. What side effects may I notice from receiving this medicine? Side effects that you should report to your doctor or health care professional as soon as possible: -allergic reactions like skin rash, itching or hives, swelling of the face, lips, or tongue -changes in vision -decreased hearing or ringing of the ears -redness, blistering, peeling or loosening of the skin, including inside the mouth -seizures -sensitivity to light -signs and symptoms of a dangerous change in heartbeat or heart rhythm like chest pain; dizziness; fast or irregular heartbeat; palpitations; feeling faint or lightheaded, falls; breathing problems -signs and symptoms of liver injury like dark yellow or brown urine; general ill feeling or flu-like symptoms; light-colored stools; loss of appetite; nausea; right upper belly pain; unusually weak or tired; yellowing of the eyes or skin -signs and symptoms of low blood sugar such as feeling anxious; confusion; dizziness; increased hunger; unusually weak or tired; sweating; shakiness; cold; irritable; headache; blurred vision; fast heartbeat; loss of consciousness -uncontrollable head, mouth, neck, arm, or leg movements Side effects that usually do not require medical attention (report to your doctor or health care professional if they continue or are bothersome): -anxious -diarrhea -dizziness -hair loss -headache -irritable -loss of appetite -nausea, vomiting -stomach pain This list may not describe all possible side effects. Call your doctor for medical advice about side effects. You may report side effects to FDA at 1-800-FDA-1088. Where should I keep my medicine? Keep out of the reach of children. In children, this medicine can cause overdose with small doses. Store at room temperature between 15 and 30 degrees C (59 and 86 degrees  F). Protect from moisture and light. Throw away any unused medicine after the expiration date. NOTE: This sheet is a summary. It may not cover all possible information. If you have questions about this medicine, talk to your doctor, pharmacist, or health care provider.  2018 Elsevier/Gold Standard (2015-11-11 14:16:15)   Back Exercises The following exercises strengthen the muscles that help to support the back. They also help to keep the lower back flexible. Doing these exercises can help to prevent back pain or lessen existing pain. If you have back pain or discomfort, try doing these exercises 2-3 times each day or as told by your health care provider. When the pain goes away, do them once each day, but increase the number of times that you repeat the steps for each exercise (do more repetitions). If you do not have back pain or discomfort, do these exercises once each day or as told by your health care provider. Exercises Single Knee to Chest   Repeat these steps 3-5 times for each leg: 1. Lie on your back on a firm bed or the floor with your legs extended. 2. Bring one knee to your chest. Your other leg should stay extended and in contact with the floor. 3. Hold your knee in place by grabbing your knee or thigh. 4. Pull on your knee until you feel a gentle stretch in your lower back. 5. Hold the stretch for 10-30 seconds. 6. Slowly release and straighten your leg. Pelvic Tilt   Repeat these steps 5-10 times: 1. Lie on  your back on a firm bed or the floor with your legs extended. 2. Bend your knees so they are pointing toward the ceiling and your feet are flat on the floor. 3. Tighten your lower abdominal muscles to press your lower back against the floor. This motion will tilt your pelvis so your tailbone points up toward the ceiling instead of pointing to your feet or the floor. 4. With gentle tension and even breathing, hold this position for 5-10 seconds. Cat-Cow   Repeat these  steps until your lower back becomes more flexible: 1. Get into a hands-and-knees position on a firm surface. Keep your hands under your shoulders, and keep your knees under your hips. You may place padding under your knees for comfort. 2. Let your head hang down, and point your tailbone toward the floor so your lower back becomes rounded like the back of a cat. 3. Hold this position for 5 seconds. 4. Slowly lift your head and point your tailbone up toward the ceiling so your back forms a sagging arch like the back of a cow. 5. Hold this position for 5 seconds. Press-Ups   Repeat these steps 5-10 times: 1. Lie on your abdomen (face-down) on the floor. 2. Place your palms near your head, about shoulder-width apart. 3. While you keep your back as relaxed as possible and keep your hips on the floor, slowly straighten your arms to raise the top half of your body and lift your shoulders. Do not use your back muscles to raise your upper torso. You may adjust the placement of your hands to make yourself more comfortable. 4. Hold this position for 5 seconds while you keep your back relaxed. 5. Slowly return to lying flat on the floor. Bridges   Repeat these steps 10 times: 1. Lie on your back on a firm surface. 2. Bend your knees so they are pointing toward the ceiling and your feet are flat on the floor. 3. Tighten your buttocks muscles and lift your buttocks off of the floor until your waist is at almost the same height as your knees. You should feel the muscles working in your buttocks and the back of your thighs. If you do not feel these muscles, slide your feet 1-2 inches farther away from your buttocks. 4. Hold this position for 3-5 seconds. 5. Slowly lower your hips to the starting position, and allow your buttocks muscles to relax completely. If this exercise is too easy, try doing it with your arms crossed over your chest. Abdominal Crunches   Repeat these steps 5-10 times: 1. Lie on your  back on a firm bed or the floor with your legs extended. 2. Bend your knees so they are pointing toward the ceiling and your feet are flat on the floor. 3. Cross your arms over your chest. 4. Tip your chin slightly toward your chest without bending your neck. 5. Tighten your abdominal muscles and slowly raise your trunk (torso) high enough to lift your shoulder blades a tiny bit off of the floor. Avoid raising your torso higher than that, because it can put too much stress on your low back and it does not help to strengthen your abdominal muscles. 6. Slowly return to your starting position. Back Lifts  Repeat these steps 5-10 times: 1. Lie on your abdomen (face-down) with your arms at your sides, and rest your forehead on the floor. 2. Tighten the muscles in your legs and your buttocks. 3. Slowly lift your chest off of  the floor while you keep your hips pressed to the floor. Keep the back of your head in line with the curve in your back. Your eyes should be looking at the floor. 4. Hold this position for 3-5 seconds. 5. Slowly return to your starting position. Contact a health care provider if:  Your back pain or discomfort gets much worse when you do an exercise.  Your back pain or discomfort does not lessen within 2 hours after you exercise. If you have any of these problems, stop doing these exercises right away. Do not do them again unless your health care provider says that you can. Get help right away if:  You develop sudden, severe back pain. If this happens, stop doing the exercises right away. Do not do them again unless your health care provider says that you can. This information is not intended to replace advice given to you by your health care provider. Make sure you discuss any questions you have with your health care provider. Document Released: 05/05/2004 Document Revised: 08/05/2015 Document Reviewed: 05/22/2014 Elsevier Interactive Patient Education  2017 [employer].

## 2016-07-25 NOTE — Progress Notes (Signed)
Pharmacy Note  Subjective: Patient presents today to the [facility] to see Dr. Corliss Skains.  Patient is currently taking hydroxychloroquine 200 mg BID.  Patient seen by the pharmacist for counseling on hydroxychloroquine.    Objective: CBC, CMP ordered today  Assessment/Plan: Patient was counseled on the purpose, proper use, and adverse effects of hydroxychloroquine including nausea/diarrhea, skin rash, headaches, and sun sensitivity.  Discussed importance of annual eye exams while on hydroxychloroquine to monitor to ocular toxicity and discussed importance of frequent laboratory monitoring.  Provided patient with eye exam form for baseline ophthalmologic exam and standing lab instructions.  Provided patient with educational materials on hydroxychloroquine and answered all questions.  Patient consented to hydroxychloroquine.  Will upload consent in the media tab.  Discussed hydroxychloroquine dose with Dr. Corliss Skains.  Patient was advised to reduce hydroxychloroquine dose to 200 mg BID Monday through Friday.  Patient voiced understanding.    Lilla Shook, Pharm.D., BCPS Clinical Pharmacist Pager: [phone] Phone: [phone] 07/25/2016 11:01 AM

## 2016-07-25 NOTE — Progress Notes (Signed)
Office Visit Note  Patient: Toni Price             Date of Birth: 14-Nov-1955           MRN: [id number]             PCP: Adline Mango, MD Referring: Karleen Hampshire., MD Visit Date: 07/25/2016 Occupation: @GUAROCC @    Subjective:  New Patient (Initial Visit) (patient states she has seen you in the past elevated ANA ) and Dry Eye (also dry mouth )   History of Present Illness: Toni Price is a 61 y.o. female seen in consultation per request of her PCP. According to patient she came to see me about 10 years ago at the time she was having some sicca symptoms and was diagnosed with Sjogren's. She had positive ANA and positive HLA-B27 per patient I do not have those records available. She states she was on Plaquenil for a few years and discontinue Plaquenil about 4 years ago. She continued to get Plaquenil through her PCP. Cording to patient in June 2017 she went to see her PCP for a follow-up visit and her ANA was 1:1280. She was also complaining of some joint pain and sicca symptoms she was referred to me. She also gives history of osteoporosis. She is on no treatment for it. As she does not like the side effects of the medications.  Activities of Daily Living:  Patient reports morning stiffness for 5 minutes.   Patient Reports nocturnal pain.  Difficulty dressing/grooming: Denies Difficulty climbing stairs: Denies Difficulty getting out of chair: Denies Difficulty using hands for taps, buttons, cutlery, and/or writing: Denies   Review of Systems  Constitutional: Negative for fatigue, night sweats, weight gain, weight loss and weakness.  HENT: Positive for mouth dryness. Negative for mouth sores, trouble swallowing, trouble swallowing and nose dryness.   Eyes: Positive for dryness. Negative for pain, redness and visual disturbance.  Respiratory: Negative for cough, shortness of breath and difficulty breathing.   Cardiovascular: Positive for palpitations. Negative for chest pain,  hypertension, irregular heartbeat and swelling in legs/feet.  Gastrointestinal: Negative for blood in stool, constipation and diarrhea.  Endocrine: Negative for increased urination.  Genitourinary: Negative for vaginal dryness.  Musculoskeletal: Positive for arthralgias and joint pain. Negative for joint swelling, myalgias, muscle weakness, morning stiffness, muscle tenderness and myalgias.  Skin: Negative for color change, rash, hair loss, skin tightness, ulcers and sensitivity to sunlight.  Allergic/Immunologic: Negative for susceptible to infections.  Neurological: Negative for dizziness, memory loss and night sweats.  Hematological: Negative for swollen glands.  Psychiatric/Behavioral: Positive for depressed mood. The patient is nervous/anxious.     PMFS History:  Patient Active Problem List   Diagnosis Date Noted  . History of hypothyroidism 07/22/2016  . History of depression 07/22/2016  . History of anxiety 07/22/2016  . Family history of B12 deficiency 07/22/2016  . Osteoporosis 02/05/2014    Past Medical History:  Diagnosis Date  . Anxiety   . Depression   . Dry eye syndrome 2008  . Iron deficiency anemia 07/2005  . MVP (mitral valve prolapse)    --requires prophylaxis  . Osteoporosis   . Sjogren's disease ([place]) 2008  . Thyroid disease    hypothyroid    Family History  Problem Relation Age of Onset  . Hypertension Father   . Diabetes Father   . Dementia Father   . Hypertension Mother   . Thyroid disease Mother   . Cancer Mother 24  pancreatic  . Breast cancer Paternal Grandmother     ?skin CA instead of Breast CA  . Diabetes Paternal Grandmother    Past Surgical History:  Procedure Laterality Date  . AUGMENTATION MAMMAPLASTY  02/2009   saling-Dr. Stephanie Coup  . ENDOMETRIAL BIOPSY  06/2006,06/2011   06/2006--benign proliferative endometrium, 06/2011--benign proliferative endometrium  . MOUTH SURGERY  2001   upper jaw surgery--Dr. Megan Salon  . TUBAL LIGATION      Social History   Social History Narrative  . No narrative on file     Objective: Vital Signs: BP 120/64   Pulse 78   Resp 14   Ht 5' 2"  (1.575 m)   Wt 141 lb (64 kg)   LMP 08/09/2009   BMI 25.79 kg/m    Physical Exam  Constitutional: She is oriented to person, place, and time. She appears well-developed and well-nourished.  HENT:  Head: Normocephalic and atraumatic.  Eyes: Conjunctivae and EOM are normal.  Neck: Normal range of motion.  Cardiovascular: Normal rate, regular rhythm, normal heart sounds and intact distal pulses.   Pulmonary/Chest: Effort normal and breath sounds normal.  Abdominal: Soft. Bowel sounds are normal.  Lymphadenopathy:    She has no cervical adenopathy.  Neurological: She is alert and oriented to person, place, and time.  Skin: Skin is warm and dry. Capillary refill takes less than 2 seconds.  Psychiatric: She has a normal mood and affect. Her behavior is normal.  Nursing note and vitals reviewed.    Musculoskeletal Exam: C-spine and thoracic lumbar spine good range of motion. Shoulder joints elbow joints wrist joint MCPs PIPs DIPs with good range of motion with no synovitis. Hip joints knee joints ankles MTPs PIPs DIPs with good range of motion with no synovitis.  CDAI Exam: No CDAI exam completed.    Investigation: Findings:  09/25/2015 CMP normal, LDL 122, TSH normal, ANA positive greater than 1:1280 speckled, ESR 10, CBC normal    Imaging: No results found.  Speciality Comments: No specialty comments available.    Procedures:  No procedures performed Allergies: Sulfa antibiotics   Assessment / Plan:     Visit Diagnoses: Sjogren's syndrome with keratoconjunctivitis sicca ([place]) she has positive ANA greater than 1:1280 speckled pattern. She gives history of sicca symptoms arthralgias. She states she's been started on Plaquenil by Dr. Rozetta Nunnery few months back which relieved her arthralgias. At this point she will continue Plaquenil. I  will obtain additional labs which are listed as follows. A handout on Plaquenil was given consent was taken. She will also need yearly eye examinations. His standing orders will be done every 5 months to monitor for drug toxicity.  Lower back pain: She has no point tenderness on examination. She states she's HLA-B27 positive. I do not have any report on that. She had no SI joint tenderness. I've given her a handout on back exercises.  Osteoporosis: I do not have report but patient states that she has history of osteoporosis and she does not like to take any medications.  History of hypothyroidism  History of depression  History of anxiety  B12 deficiency    Orders: Orders Placed This Encounter  Procedures  . CK  . [id number] ENA PANEL  . C3 and C4  . Cardiolipin antibodies, IgG, IgM, IgA  . Lupus anticoagulant panel  . Beta-2 glycoprotein antibodies  . CBC with Differential/Platelet  . COMPLETE METABOLIC PANEL WITH GFR  . CBC with Differential/Platelet  . COMPLETE METABOLIC PANEL WITH GFR  . Urinalysis, Routine  w reflex microscopic   No orders of the defined types were placed in this encounter.   Face-to-face time spent with patient was 50 minutes. 50% of time was spent in counseling and coordination of care.  Follow-Up Instructions: Return in about 3 months (around 10/24/2016) for Sjogren's.   Bo Merino, MD  Note - This record has been created using Editor, commissioning.  Chart creation errors have been sought, but may not always  have been located. Such creation errors do not reflect on  the standard of medical care.

## 2016-07-26 LAB — CP5000020 ENA PANEL
ENA SM AB SER-ACNC: NEGATIVE
Ribonucleic Protein(ENA) Antibody, IgG: <1
SSA (Ro) (ENA) Antibody, IgG: >8 — ABNORMAL HIGH
SSB (La) (ENA) Antibody, IgG: 4 — ABNORMAL HIGH
Scleroderma (Scl-70) (ENA) Antibody, IgG: <1
ds DNA Ab: 1 IU/mL

## 2016-07-26 LAB — COMPLETE METABOLIC PANEL WITH GFR
ALT: 24 U/L (ref 6–29)
AST: 31 U/L (ref 10–35)
Albumin: 4.5 g/dL (ref 3.6–5.1)
Alkaline Phosphatase: 80 U/L (ref 33–130)
BILIRUBIN TOTAL: 0.4 mg/dL (ref 0.2–1.2)
BUN: 19 mg/dL (ref 7–25)
CO2: 23 mmol/L (ref 20–31)
CREATININE: 0.71 mg/dL (ref 0.50–1.05)
Calcium: 9.9 mg/dL (ref 8.6–10.4)
Chloride: 101 mmol/L (ref 98–110)
GFR, Est African American: >89 mL/min (ref >=60)
GFR, Est Non African American: >89 mL/min (ref >=60)
GLUCOSE: 85 mg/dL (ref 65–99)
Potassium: 4.4 mmol/L (ref 3.5–5.3)
SODIUM: 139 mmol/L (ref 135–146)
TOTAL PROTEIN: 7.6 g/dL (ref 6.1–8.1)

## 2016-07-26 LAB — URINALYSIS, ROUTINE W REFLEX MICROSCOPIC
Bilirubin Urine: NEGATIVE
GLUCOSE, UA: NEGATIVE
HGB URINE DIPSTICK: NEGATIVE
KETONES UR: NEGATIVE
Leukocytes, UA: NEGATIVE
Nitrite: NEGATIVE
Protein, ur: NEGATIVE
Specific Gravity, Urine: 1.013 (ref 1.001–1.035)
pH: 5.5 (ref 5.0–8.0)

## 2016-07-26 LAB — C3 AND C4
C3 COMPLEMENT: 129 mg/dL (ref 83–193)
C4 Complement: 28 mg/dL (ref 15–57)

## 2016-07-26 LAB — CK: Total CK: 81 U/L (ref 7–177)

## 2016-07-27 LAB — BETA-2 GLYCOPROTEIN ANTIBODIES
Beta-2 Glyco I IgG: <9 SGU (ref <=20)
Beta-2-Glycoprotein I IgA: <9 SAU (ref <=20)

## 2016-07-27 LAB — CARDIOLIPIN ANTIBODIES, IGG, IGM, IGA

## 2016-07-28 LAB — RFX PTT-LA W/RFX TO HEX PHASE CONF: PTT-LA Screen: 31 s (ref <=40)

## 2016-07-28 LAB — RFX DRVVT SCR W/RFLX CONF 1:1 MIX: dRVVT Screen: 32 s (ref <=45)

## 2016-07-28 LAB — LUPUS ANTICOAGULANT PANEL

## 2016-07-29 NOTE — Progress Notes (Signed)
Labs c/w Sjogrens. Will discuss at fu visit. Recommend EKG if she has not had one as there is increased risk of arrythmia with +Ro.

## 2016-08-09 ENCOUNTER — Telehealth: Payer: Self-pay | Admitting: Radiology

## 2016-08-09 NOTE — Telephone Encounter (Signed)
Patient has had normal plq eye exam Have sent for scanning.

## 2016-08-22 ENCOUNTER — Ambulatory Visit: Payer: Self-pay | Admitting: Rheumatology

## 2016-10-21 DIAGNOSIS — E538 Deficiency of other specified B group vitamins: Secondary | ICD-10-CM | POA: Insufficient documentation

## 2016-10-21 DIAGNOSIS — M3501 Sicca syndrome with keratoconjunctivitis: Secondary | ICD-10-CM | POA: Insufficient documentation

## 2016-10-21 NOTE — Progress Notes (Deleted)
Office Visit Note  Patient: Toni Price             Date of Birth: 04-10-1956           MRN: [id number]             PCP: Laqueta Due., MD Referring: Laqueta Due., MD Visit Date: 10/24/2016 Occupation: @GUAROCC @    Subjective:  No chief complaint on file.   History of Present Illness: Toni Price is a 61 y.o. female ***   Activities of Daily Living:  Patient reports morning stiffness for *** {minute/hour:19697}.   Patient {ACTIONS;DENIES/REPORTS:21021675::"Denies"} nocturnal pain.  Difficulty dressing/grooming: {ACTIONS;DENIES/REPORTS:21021675::"Denies"} Difficulty climbing stairs: {ACTIONS;DENIES/REPORTS:21021675::"Denies"} Difficulty getting out of chair: {ACTIONS;DENIES/REPORTS:21021675::"Denies"} Difficulty using hands for taps, buttons, cutlery, and/or writing: {ACTIONS;DENIES/REPORTS:21021675::"Denies"}   No Rheumatology ROS completed.   PMFS History:  Patient Active Problem List   Diagnosis Date Noted  . Sjogren's syndrome with keratoconjunctivitis sicca (HCC) 10/21/2016  . B12 deficiency 10/21/2016  . History of hypothyroidism 07/22/2016  . History of depression 07/22/2016  . History of anxiety 07/22/2016  . Family history of B12 deficiency 07/22/2016  . Osteoporosis 02/05/2014    Past Medical History:  Diagnosis Date  . Anxiety   . Depression   . Dry eye syndrome 2008  . Iron deficiency anemia 07/2005  . MVP (mitral valve prolapse)    --requires prophylaxis  . Osteoporosis   . Sjogren's disease (HCC) 2008  . Thyroid disease    hypothyroid    Family History  Problem Relation Age of Onset  . Hypertension Father   . Diabetes Father   . Dementia Father   . Hypertension Mother   . Thyroid disease Mother   . Cancer Mother 33       pancreatic  . Breast cancer Paternal Grandmother        ?skin CA instead of Breast CA  . Diabetes Paternal Grandmother    Past Surgical History:  Procedure Laterality Date  . AUGMENTATION MAMMAPLASTY  02/2009     saling-Dr. Benna Dunks  . ENDOMETRIAL BIOPSY  06/2006,06/2011   06/2006--benign proliferative endometrium, 06/2011--benign proliferative endometrium  . MOUTH SURGERY  2001   upper jaw surgery--Dr. Orvan Falconer  . TUBAL LIGATION     Social History   Social History Narrative  . No narrative on file     Objective: Vital Signs: LMP 08/09/2009    Physical Exam   Musculoskeletal Exam: ***  CDAI Exam: No CDAI exam completed.    Investigation: Findings:  07/25/2016 ENA panel positive Ro greater than 8, positive La 4.0,  Beta-2 glycoprotein antibodies normal, C3 and C4 normal, Cardiolipin antibodies, IgG, IgM, IgA normal, CK normal, Lupus anticoagulant panel normal, and Urinalysis, Routine w reflex microscopic normal    CBC Latest Ref Rng & Units 07/25/2016  WBC 3.8 - 10.8 K/uL 4.6  Hemoglobin 11.7 - 15.5 g/dL 40.9  Hematocrit 81.1 - 45.0 % 37.5  Platelets 140 - 400 K/uL 276   CMP Latest Ref Rng & Units 07/25/2016  Glucose 65 - 99 mg/dL 85  BUN 7 - 25 mg/dL 19  Creatinine 9.14 - 7.82 mg/dL 9.56  Sodium 213 - 086 mmol/L 139  Potassium 3.5 - 5.3 mmol/L 4.4  Chloride 98 - 110 mmol/L 101  CO2 20 - 31 mmol/L 23  Calcium 8.6 - 10.4 mg/dL 9.9  Total Protein 6.1 - 8.1 g/dL 7.6  Total Bilirubin 0.2 - 1.2 mg/dL 0.4  Alkaline Phos 33 - 130 U/L 80  AST 10 - 35  U/L 31  ALT 6 - 29 U/L 24    Imaging: No results found.  Speciality Comments: No specialty comments available.    Procedures:  No procedures performed Allergies: Sulfa antibiotics   Assessment / Price:     Visit Diagnoses: Sjogren's syndrome with keratoconjunctivitis sicca (HCC)  Age-related osteoporosis without current pathological fracture  History of anxiety  History of depression  History of hypothyroidism  B12 deficiency    Orders: No orders of the defined types were placed in this encounter.  No orders of the defined types were placed in this encounter.   Face-to-face time spent with patient was ***  minutes. 50% of time was spent in counseling and coordination of care.  Follow-Up Instructions: No Follow-up on file.   Elva Breaker, RT  Note - This record has been created using [employer] software.  Chart creation errors have been sought, but may not always  have been located. Such creation errors do not reflect on  the standard of medical care.

## 2016-10-24 ENCOUNTER — Ambulatory Visit: Payer: 59 | Admitting: Rheumatology
